# Patient Record
Sex: Male | Born: 1970 | Race: White | Hispanic: No | Marital: Single | Smoking: Never smoker
Health system: Southern US, Community
[De-identification: ages and names within clinical notes are randomized; demographics above are authoritative.]

## PROBLEM LIST (undated history)

## (undated) HISTORY — PX: CATARACT EXTRACTION, BILATERAL: SHX1313

---

## 2016-12-12 DIAGNOSIS — S60211A Contusion of right wrist, initial encounter: Secondary | ICD-10-CM | POA: Insufficient documentation

## 2016-12-12 DIAGNOSIS — Y999 Unspecified external cause status: Secondary | ICD-10-CM | POA: Insufficient documentation

## 2016-12-12 DIAGNOSIS — S60212A Contusion of left wrist, initial encounter: Secondary | ICD-10-CM | POA: Insufficient documentation

## 2016-12-12 DIAGNOSIS — Y939 Activity, unspecified: Secondary | ICD-10-CM | POA: Insufficient documentation

## 2016-12-12 DIAGNOSIS — Z23 Encounter for immunization: Secondary | ICD-10-CM | POA: Insufficient documentation

## 2016-12-12 DIAGNOSIS — Y929 Unspecified place or not applicable: Secondary | ICD-10-CM | POA: Insufficient documentation

## 2016-12-12 DIAGNOSIS — W0110XA Fall on same level from slipping, tripping and stumbling with subsequent striking against unspecified object, initial encounter: Secondary | ICD-10-CM | POA: Insufficient documentation

## 2016-12-13 ENCOUNTER — Encounter (HOSPITAL_COMMUNITY): Payer: Self-pay | Admitting: *Deleted

## 2016-12-13 ENCOUNTER — Emergency Department (HOSPITAL_COMMUNITY): Payer: Self-pay

## 2016-12-13 ENCOUNTER — Emergency Department (HOSPITAL_COMMUNITY)
Admission: EM | Admit: 2016-12-13 | Discharge: 2016-12-13 | Disposition: A | Discharge status: Home / Self Care | Payer: Self-pay | Attending: Emergency Medicine | Admitting: Emergency Medicine

## 2016-12-13 DIAGNOSIS — S60212A Contusion of left wrist, initial encounter: Secondary | ICD-10-CM

## 2016-12-13 DIAGNOSIS — S60211A Contusion of right wrist, initial encounter: Secondary | ICD-10-CM

## 2016-12-13 MED ORDER — TETANUS-DIPHTH-ACELL PERTUSSIS 5-2.5-18.5 LF-MCG/0.5 IM SUSP
0.5000 mL | Freq: Once | INTRAMUSCULAR | Status: AC
  Administered 2016-12-13: 0.5 mL via INTRAMUSCULAR
  Filled 2016-12-13: qty 0.5

## 2016-12-13 MED ORDER — OXYCODONE-ACETAMINOPHEN 5-325 MG PO TABS
2.0000 | ORAL_TABLET | Freq: Once | ORAL | Status: AC
  Administered 2016-12-13: 2 via ORAL
  Filled 2016-12-13: qty 2

## 2016-12-13 MED ORDER — NAPROXEN 500 MG PO TABS: 500.0000 mg | ORAL_TABLET | Freq: Two times a day (BID) | ORAL | 0 refills | Status: AC

## 2016-12-13 NOTE — ED Triage Notes (Signed)
Pt states he fell off his lawnmower today and landed on both wrists; pt states he has numbness to both wrists with bruising and swelling; left wrist has 2 clear liquid blisters with purple bruising around them

## 2016-12-13 NOTE — Discharge Instructions (Signed)
The Xrays show no fracture. Wear the splints until followup with Dr. Romeo Apple as it is possible that you could have a fracture that is not showing up yet. Return to the ED if you develop worsening pain, weakness, numbness, tingling, or any other concerns.

## 2016-12-13 NOTE — ED Provider Notes (Signed)
[facility] DEPT Provider Note   CSN: [id number] Arrival date & time: 12/12/16  2345     History   Chief Complaint Chief Complaint  Patient presents with  . Hand Injury    HPI Cesar Rogers is a 46 y.o. male.  Patient presents with bilateral wrist pain after falling from a standing position onto outstretched hands. States he was trying to put his lawnmower away when he fell forward striking both his hands and wrists. This happened around 6:30 PM. He has bilateral swelling and ecchymosis to both wrists left greater than right. Did not hit head or lose consciousness. No neck or back pain. Patient states both hands feel "numb" but he still has sensation and denies any pins and needles feeling. No other medical problems. Does not take any blood thinners.   The history is provided by the patient.  Hand Injury   Pertinent negatives include no fever.    History reviewed. No pertinent past medical history.  There are no active problems to display for this patient.   Past Surgical History:  Procedure Laterality Date  . CATARACT EXTRACTION, BILATERAL Bilateral        Home Medications    Prior to Admission medications   Not on File    Family History History reviewed. No pertinent family history.  Social History Social History  Substance Use Topics  . Smoking status: Never Smoker  . Smokeless tobacco: Never Used  . Alcohol use Yes     Comment: everyday     Allergies   Patient has no known allergies.   Review of Systems Review of Systems  Constitutional: Negative for activity change, appetite change and fever.  HENT: Negative for congestion.   Respiratory: Negative for cough, chest tightness and shortness of breath.   Cardiovascular: Negative for chest pain.  Gastrointestinal: Negative for abdominal pain, nausea and vomiting.  Genitourinary: Negative for dysuria and hematuria.  Musculoskeletal: Positive for arthralgias.  Skin: Positive for wound.    Neurological: Negative for dizziness, weakness, light-headedness and headaches.    all other systems are negative except as noted in the HPI and PMH.    Physical Exam Updated Vital Signs BP 137/77 (BP Location: Right Arm)   Pulse 98   Temp 97.7 F (36.5 C) (Oral)   Resp 18   Ht 6' (1.829 m)   Wt 96.2 kg (212 lb)   SpO2 100%   BMI 28.75 kg/m   Physical Exam  Constitutional: He is oriented to person, place, and time. He appears well-developed and well-nourished. No distress.  HENT:  Head: Normocephalic and atraumatic.  Mouth/Throat: Oropharynx is clear and moist. No oropharyngeal exudate.  Eyes: Pupils are equal, round, and reactive to light. Conjunctivae and EOM are normal.  Neck: Normal range of motion. Neck supple.  No C spine pain  Cardiovascular: Normal rate, regular rhythm, normal heart sounds and intact distal pulses.   No murmur heard. Pulmonary/Chest: Effort normal and breath sounds normal. No respiratory distress.  Abdominal: Soft. There is no tenderness. There is no rebound and no guarding.  Musculoskeletal: Normal range of motion. He exhibits edema and tenderness.  No T or L spine pain  Diffuse swelling of right wrist with faint ecchymosis. There is no focal tenderness to palpation. No snuffbox tenderness. Intact radial pulse. Intact are cardinal hand movements.  Diffuse swelling of left wrist with ecchymosisI and 2 blisters on radial surface. No focal tenderness to palpation. No snuffbox tenderness. Intact radial pulse, intact cardinal hand movements  Hematoma  and ecchymosis to R upper arm. Nontender  Neurological: He is alert and oriented to person, place, and time. No cranial nerve deficit. He exhibits normal muscle tone. Coordination normal.   5/5 strength throughout. CN 2-12 intact.Equal grip strength.   Skin: Skin is warm.  Psychiatric: He has a normal mood and affect. His behavior is normal.  Nursing note and vitals reviewed.    ED Treatments / Results   Labs (all labs ordered are listed, but only abnormal results are displayed) Labs Reviewed - No data to display  EKG  EKG Interpretation None       Radiology Dg Shoulder Right  Result Date: 12/13/2016 CLINICAL DATA:  Pt states he fell off his lawnmower today and landed on both wrists; pt states he has numbness to both wrists with bruising and swelling; left wrist has 2 clear liquid blisters with purple bruising around them. EXAM: RIGHT SHOULDER - 2+ VIEW COMPARISON:  None. FINDINGS: There is no evidence of fracture or dislocation. There is no evidence of arthropathy or other focal bone abnormality. Soft tissues are unremarkable. IMPRESSION: Negative. Electronically Signed   By: Burman Nieves M.D.   On: 12/13/2016 03:00   Dg Forearm Left  Result Date: 12/13/2016 CLINICAL DATA:  Pt states he fell off his lawnmower today and landed on both wrists; pt states he has numbness to both wrists with bruising and swelling; left wrist has 2 clear liquid blisters with purple bruising around them. EXAM: LEFT FOREARM - 2 VIEW COMPARISON:  None. FINDINGS: Left radius and ulna appear intact. No evidence of acute fracture or subluxation. No focal bone lesion or bone destruction. Bone cortex and trabecular architecture appear intact. Soft tissue swelling and infiltration over the distal forearm. 2 soft tissue skin nodules demonstrated over the radial aspect of the distal forearm. No radiopaque soft tissue foreign bodies. IMPRESSION: No acute bony abnormalities. Soft tissue swelling and infiltration with small skin nodules over the distal forearm. Electronically Signed   By: Burman Nieves M.D.   On: 12/13/2016 03:00   Dg Wrist Complete Left  Result Date: 12/13/2016 CLINICAL DATA:  Pt states he fell off his lawnmower today and landed on both wrists; pt states he has numbness to both wrists with bruising and swelling; left wrist has 2 clear liquid blisters with purple bruising around them. EXAM: LEFT WRIST -  COMPLETE 3+ VIEW COMPARISON:  None. FINDINGS: Left wrist appears intact. No evidence of acute fracture or subluxation. No focal bone lesion or bone destruction. Bone cortex and trabecular architecture appear intact. Soft tissue swelling over the wrist. 2 nodular skin lesions are demonstrated in the soft tissues over the radial aspect of the distal forearm. IMPRESSION: No acute bony abnormalities. Soft tissue swelling. Focal skin lesions demonstrated in the soft tissues over the radial aspect of the distal forearm. Electronically Signed   By: Burman Nieves M.D.   On: 12/13/2016 02:57   Dg Wrist Complete Right  Result Date: 12/13/2016 CLINICAL DATA:  Pt states he fell off his lawnmower today and landed on both wrists; pt states he has numbness to both wrists with bruising and swelling; left wrist has 2 clear liquid blisters with purple bruising around them. EXAM: RIGHT WRIST - COMPLETE 3+ VIEW COMPARISON:  None. FINDINGS: Right wrist appears intact. No evidence of acute fracture or subluxation. No focal bone lesion or bone destruction. Bone cortex and trabecular architecture appear intact. Soft tissue swelling over the wrist. No radiopaque soft tissue foreign bodies. IMPRESSION: No acute bony  abnormalities.  Soft tissue swelling. Electronically Signed   By: Burman Nieves M.D.   On: 12/13/2016 02:58   Dg Hand Complete Left  Result Date: 12/13/2016 CLINICAL DATA:  Pt states he fell off his lawnmower today and landed on both wrists; pt states he has numbness to both wrists with bruising and swelling; left wrist has 2 clear liquid blisters with purple bruising around them. EXAM: LEFT HAND - COMPLETE 3+ VIEW COMPARISON:  None. FINDINGS: There is no evidence of fracture or dislocation. There is no evidence of arthropathy or other focal bone abnormality. Soft tissues are unremarkable. IMPRESSION: Negative. Electronically Signed   By: Burman Nieves M.D.   On: 12/13/2016 02:59   Dg Hand Complete  Right  Result Date: 12/13/2016 CLINICAL DATA:  Pt states he fell off his lawnmower today and landed on both wrists; pt states he has numbness to both wrists with bruising and swelling; left wrist has 2 clear liquid blisters with purple bruising around them. EXAM: RIGHT HAND - COMPLETE 3+ VIEW COMPARISON:  None. FINDINGS: There is no evidence of fracture or dislocation. There is no evidence of arthropathy or other focal bone abnormality. Soft tissues are unremarkable. IMPRESSION: Negative. Electronically Signed   By: Burman Nieves M.D.   On: 12/13/2016 02:58    Procedures Procedures (including critical care time)  Medications Ordered in ED Medications  oxyCODONE-acetaminophen (PERCOCET/ROXICET) 5-325 MG per tablet 2 tablet (not administered)  Tdap (BOOSTRIX) injection 0.5 mL (not administered)     Initial Impression / Assessment and Plan / ED Course  I have reviewed the triage vital signs and the nursing notes.  Pertinent labs & imaging results that were available during my care of the patient were reviewed by me and considered in my medical decision making (see chart for details).     Patient with bilateral wrist pain after falling. Did not hit head or lose consciousness. He has swelling and bruising to both wrists but minimal tenderness to palpation. Neurovascularly intact with soft compartments and intact pulses.  X-rays are negative for acute fractures or dislocations.' Tetanus updated given patient's abrasions.  Patient has no significant snuffbox tenderness on exam. Discussed with patient and mother that occult fractures sometimes are not seen immediately. We'll splint and have follow-up with orthopedics. Discussed anti-inflammatories, ice, elevation, return precautions including worsening pain, weakness, numbness or tingling or any other concerns. Final Clinical Impressions(s) / ED Diagnoses   Final diagnoses:  Contusion of left wrist, initial encounter  Contusion of right  wrist, initial encounter    New Prescriptions New Prescriptions   No medications on file     Glynn Octave, MD 12/13/16 [phone]

## 2016-12-13 NOTE — ED Notes (Signed)
Patient transported to X-ray 

## 2016-12-13 NOTE — ED Notes (Signed)
Pt ambulatory to waiting room. Pt verbalized understanding of discharge instructions.   

## 2017-12-24 DEATH — deceased

## 2018-04-10 IMAGING — DX DG WRIST COMPLETE 3+V*L*
4 series · 4 of 4 positions shown · non-contrast
Comparison: None.

CLINICAL DATA: Pt states he fell off his lawnmower today and landed
on both wrists; pt states he has numbness to both wrists with
bruising and swelling; left wrist has 2 clear liquid blisters with
purple bruising around them.

EXAM:
LEFT WRIST - COMPLETE 3+ VIEW

[wrist pa]
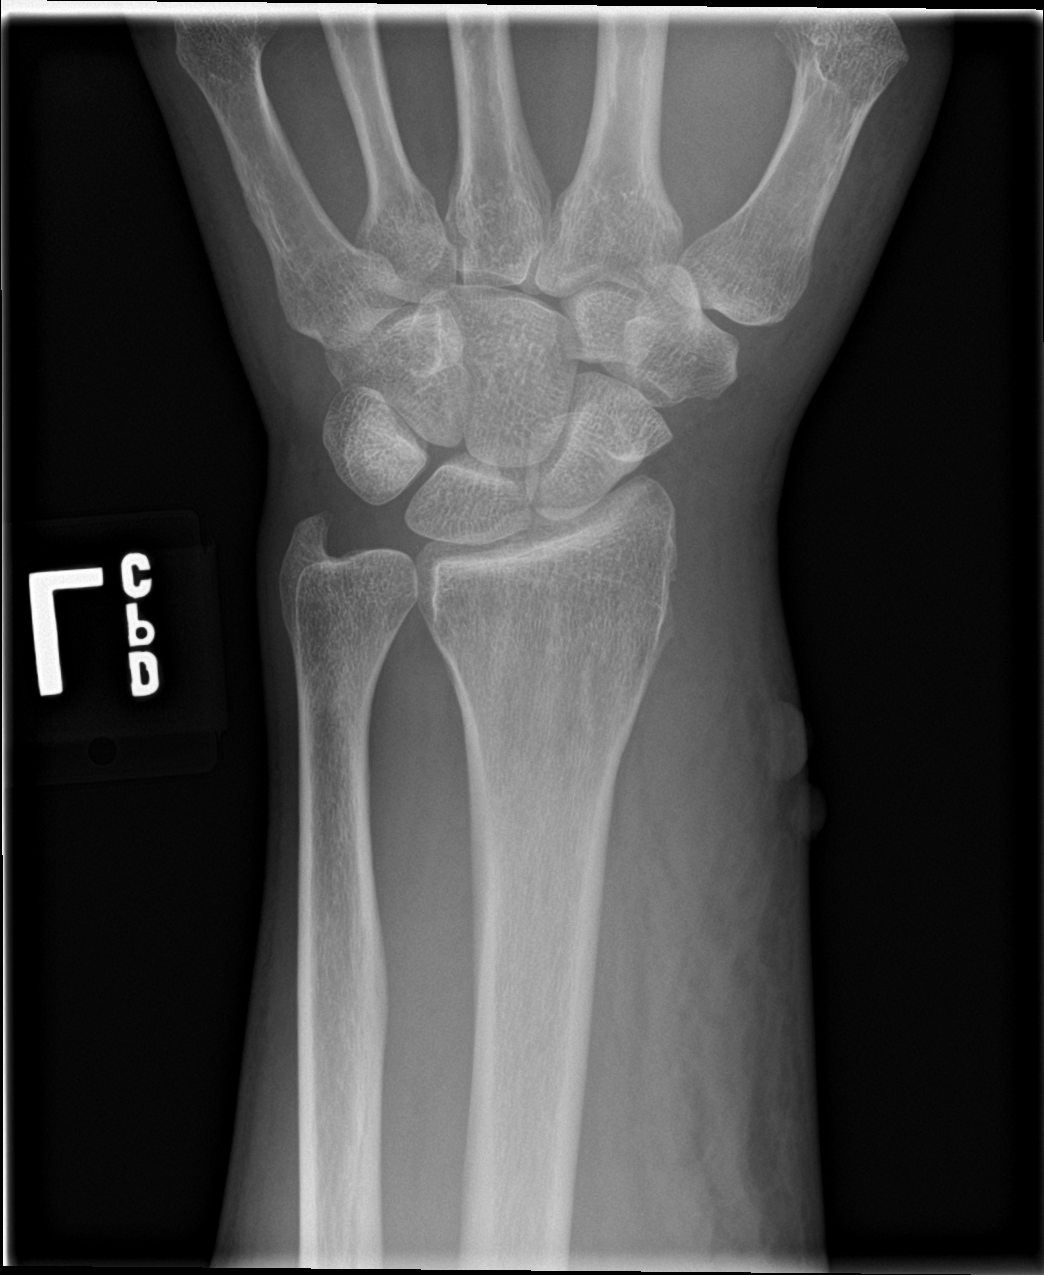

[wrist obl]
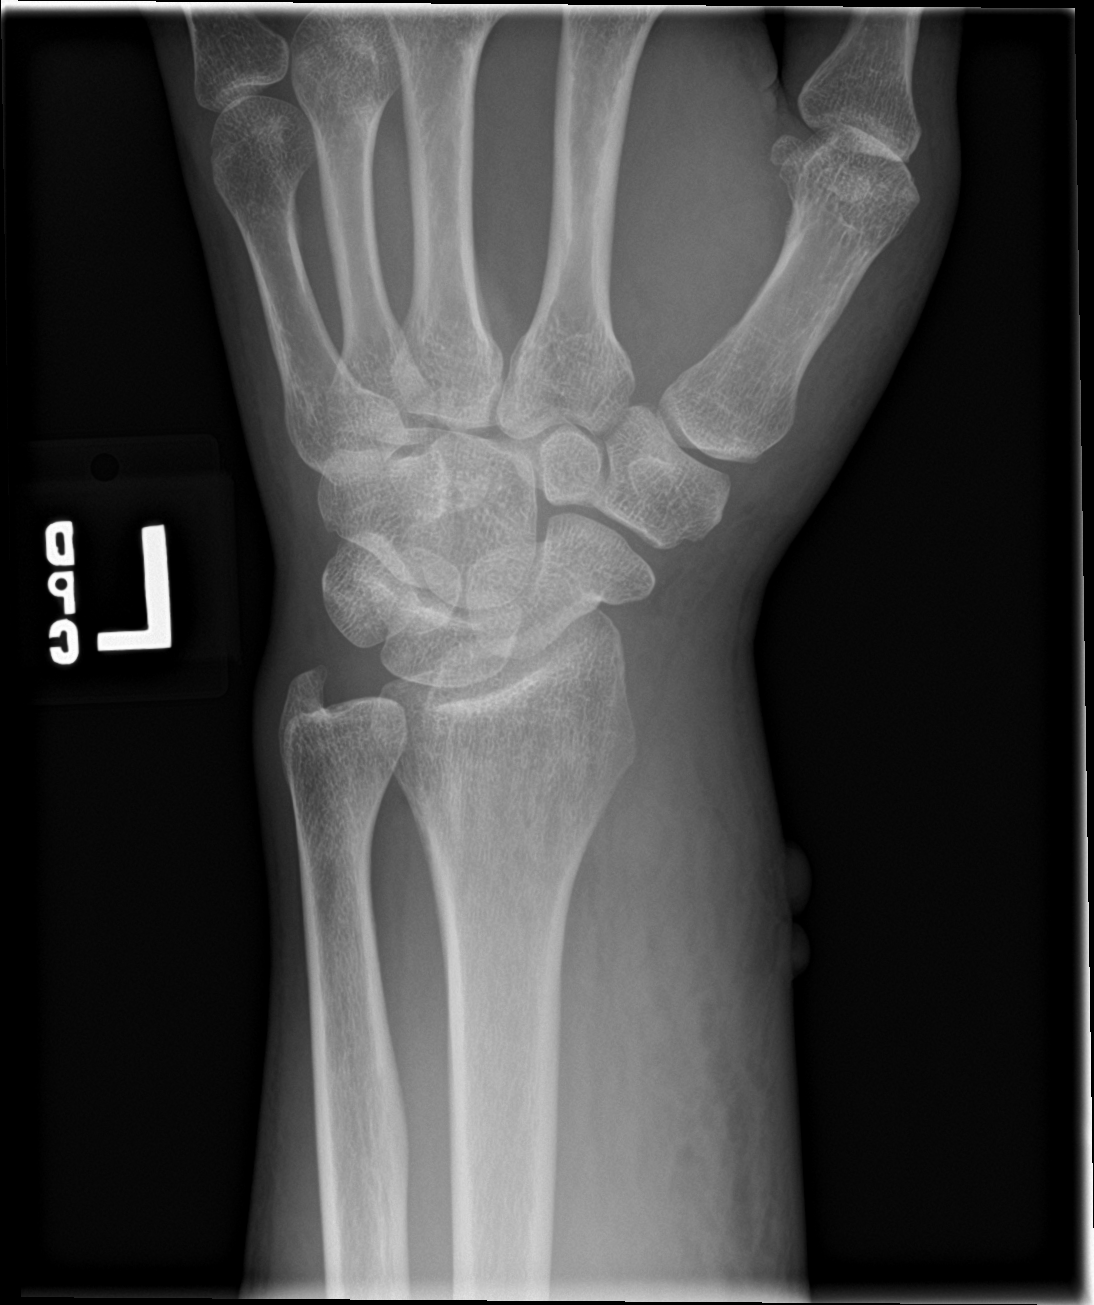

[wrist lat]
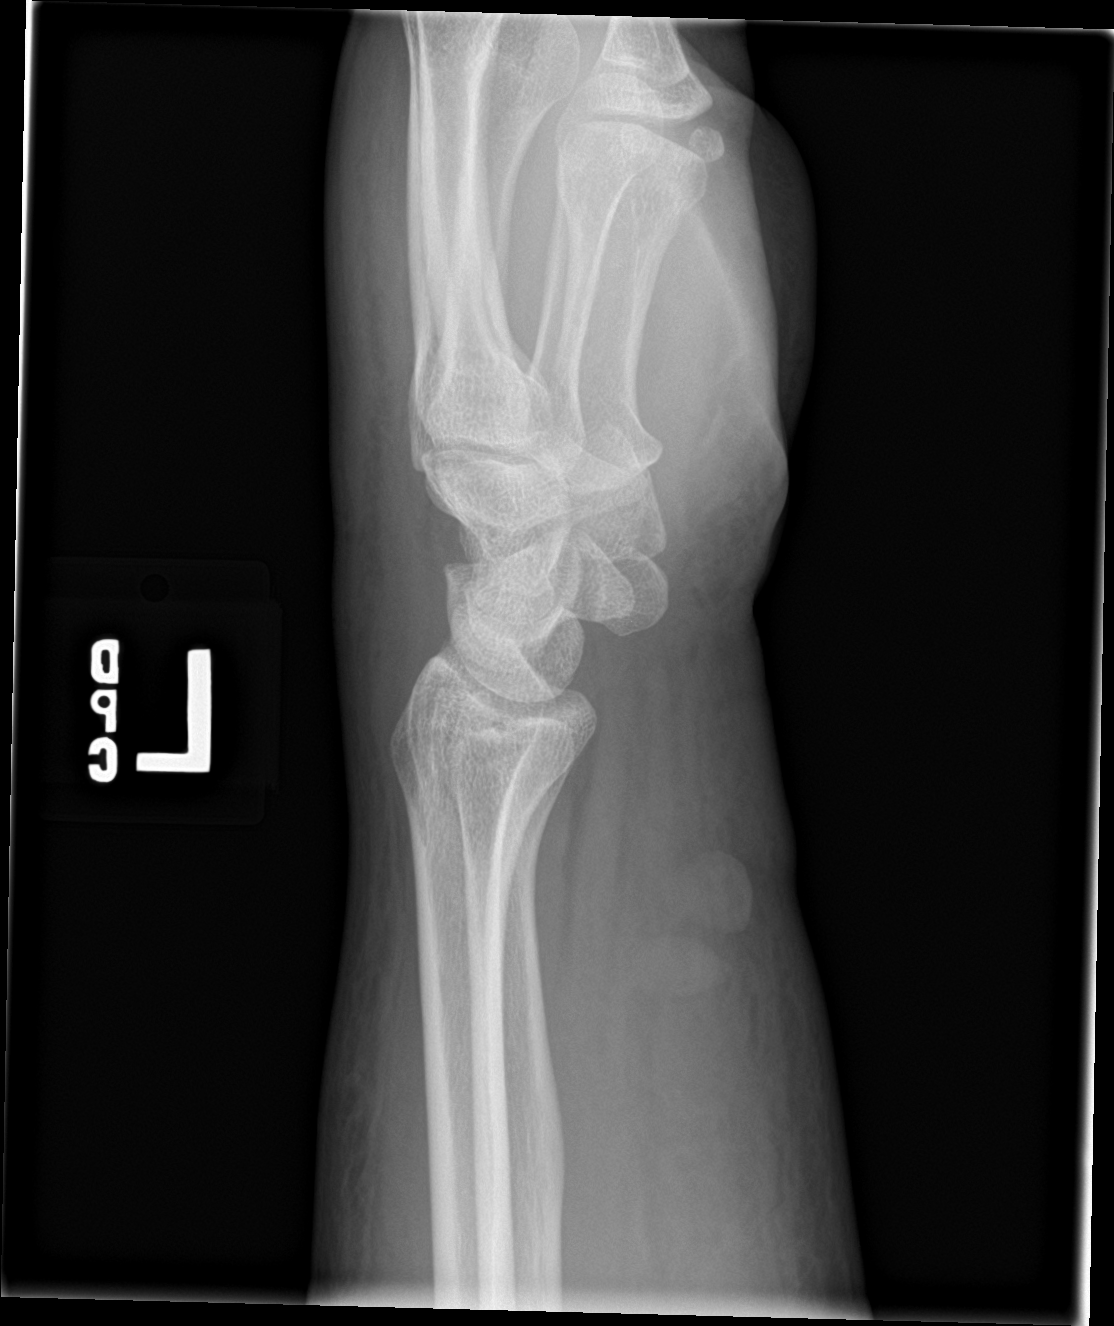

[wrist navicular]
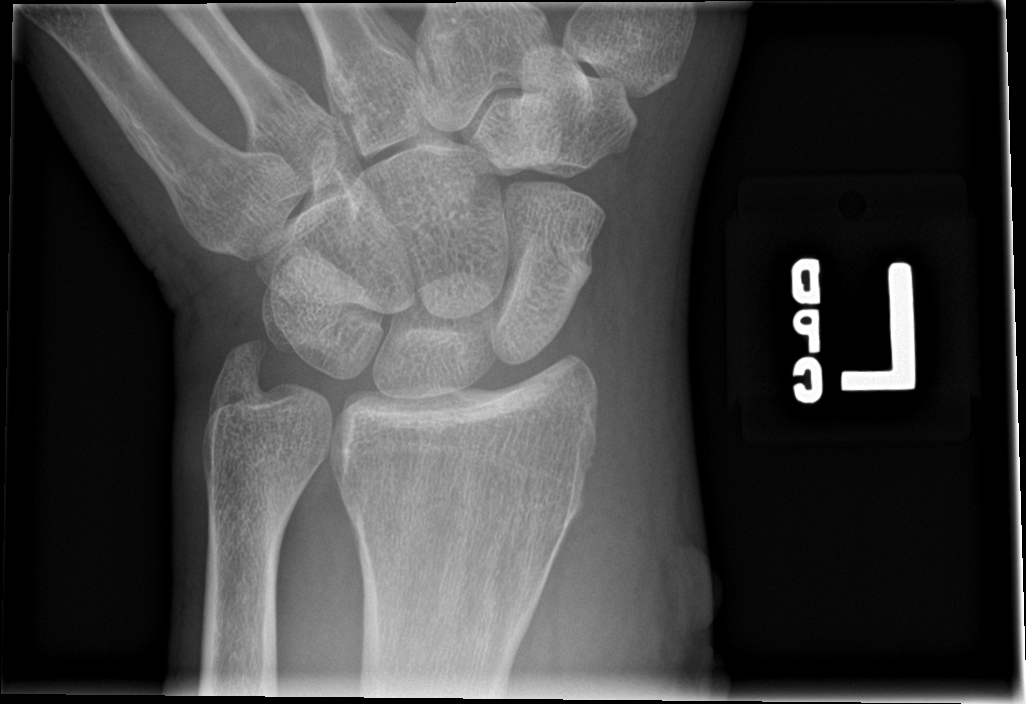

[4 of 4 positions shown; findings below may reference images not displayed]

FINDINGS: Left wrist appears intact. No evidence of acute fracture or
subluxation. No focal bone lesion or bone destruction. Bone cortex
and trabecular architecture appear intact. Soft tissue swelling over
the wrist. 2 nodular skin lesions are demonstrated in the soft
tissues over the radial aspect of the distal forearm.
IMPRESSION: No acute bony abnormalities. Soft tissue swelling. Focal skin
lesions demonstrated in the soft tissues over the radial aspect of
the distal forearm.
